# Patient Record
Sex: Female | Born: 2005 | Race: Black or African American | Hispanic: No | Marital: Single | Smoking: Current every day smoker
Health system: Southern US, Community
[De-identification: ages and names within clinical notes are randomized; demographics above are authoritative.]

## PROBLEM LIST (undated history)

## (undated) DIAGNOSIS — L509 Urticaria, unspecified: Secondary | ICD-10-CM

## (undated) DIAGNOSIS — T7840XA Allergy, unspecified, initial encounter: Secondary | ICD-10-CM

## (undated) HISTORY — DX: Allergy, unspecified, initial encounter: T78.40XA

## (undated) HISTORY — DX: Urticaria, unspecified: L50.9

## (undated) HISTORY — PX: OTHER SURGICAL HISTORY: SHX169

---
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
## 2024-01-06 ENCOUNTER — Ambulatory Visit

## 2024-01-06 ENCOUNTER — Ambulatory Visit
Admission: RE | Admit: 2024-01-06 | Discharge: 2024-01-06 | Disposition: A | Discharge status: Home / Self Care | Source: Ambulatory Visit

## 2024-01-06 VITALS — BP 101/71 | HR 122 | Temp 99.1°F | Resp 16

## 2024-01-06 DIAGNOSIS — G9331 Postviral fatigue syndrome: Secondary | ICD-10-CM | POA: Diagnosis not present

## 2024-01-06 MED ORDER — LIDOCAINE-EPINEPHRINE 1 %-1:100000 IJ SOLN: 20.0000 mL | Freq: Once | INTRAMUSCULAR | Status: DC

## 2024-01-06 MED ORDER — PREDNISONE 20 MG PO TABS: 40.0000 mg | ORAL_TABLET | Freq: Every day | ORAL | 0 refills | Status: AC

## 2024-01-06 MED ORDER — AZELASTINE HCL 0.1 % NA SOLN
1.0000 | Freq: Two times a day (BID) | NASAL | 1 refills | Status: AC
Start: 2024-01-06 — End: ?

## 2024-01-06 NOTE — ED Provider Notes (Signed)
 [facility]  Note:  This document was prepared using Dragon voice recognition software and may include unintentional dictation errors.  MRN: [id number] DOB: 2005/09/27  Subjective:   Hayley Jordan is a 18 y.o. female presenting for nasal congestion, chills, cough x 10 days.  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  Not long after working closely she started to develop symptoms.  Patient denies any home COVID testing.  Has been taking ibuprofen and Robitussin with minimal improvement.  Patient was concern for possible worsening infection because she is still having symptoms.  No shortness of breath, chest pain, weakness, dizziness, sore throat, body aches  No current facility-administered medications for this encounter.  Current Outpatient Medications:    azelastine (ASTELIN) 0.1 % nasal spray, Place 1 spray into both nostrils 2 (two) times daily. Use in each nostril as directed, Disp: 30 mL, Rfl: 1   predniSONE (DELTASONE) 20 MG tablet, Take 2 tablets (40 mg total) by mouth daily for 5 days., Disp: 10 tablet, Rfl: 0   ferrous sulfate 325 (65 FE) MG EC tablet, Take 1 tablet (325 mg total) by mouth daily with breakfast., Disp: 30 tablet, Rfl: 2   Mycophenolate Mofetil (CELLCEPT PO), Take by mouth., Disp: , Rfl:    norgestrel-ethinyl estradiol (CRYSELLE-28) 0.3-30 MG-MCG tablet, TAKE 1 TABLET BY MOUTH EVERY DAY FOR BREAKTHROUGH BLEEDING, Disp: 84 tablet, Rfl: 0   nystatin (MYCOSTATIN) 100000 UNIT/ML suspension, Take 5 mLs (500,000 Units total) by mouth 4 (four) times daily., Disp: 60 mL, Rfl: 0   ondansetron (ZOFRAN-ODT) 4 MG disintegrating tablet, Take 1 tablet (4 mg total) by mouth every 8 (eight) hours as needed for nausea or vomiting., Disp: 20 tablet, Rfl: 0   RETIN-A 0.025 % cream, Apply topically at bedtime. For first month apply 2 nights weekly, After 1st month increase to 3 nights weekly (M-W-F), Disp: 45 g,  Rfl: 3   Safety Seal Miscellaneous MISC, Apply 1 Application topically at bedtime. Medication Name: Melaxemic Cream (Tranexamic Acid 5%, Kojic Acid 2%,Vit C 2.5%, Hyaluronic Acid 0.1%) (Patient not taking: Reported on 12/08/2023), Disp: 30 g, Rfl: 6   tacrolimus (PROTOPIC) 0.1 % ointment, Apply topically 2 (two) times daily. Use on affected areas., Disp: 100 g, Rfl: 3   Allergies  Allergen Reactions   Nsaids     Autoimmune disease   Sesame Seed (Diagnostic)    Shellfish Allergy    Soy Allergy (Obsolete)     Past Medical History:  Diagnosis Date   Allergy    Folliculitis 01/29/2020   Mouth sores 01/29/2020   Urticaria      Past Surgical History:  Procedure Laterality Date   in grown toe nail      Family History  Problem Relation Age of Onset   Hypertension Mother    Asthma Father    Hypertension Father    Lung cancer Maternal Grandfather    Hypertension Paternal Grandmother    Hypertension Paternal Grandfather    Allergic rhinitis Neg Hx    Atopy Neg Hx    Immunodeficiency Neg Hx     Social History   Tobacco Use   Smoking status: Every Day    Types: Cigarettes    Passive exposure: Current   Smokeless tobacco: Never  Vaping Use   Vaping status: Never Used  Substance Use Topics   Alcohol use: Never   Drug use: Yes    Types: Marijuana    Comment: about  once a month    ROS Refer to HPI for ROS details.  Objective:    Vitals: BP 101/71 (BP Location: Right Arm)   Pulse (!) 122   Temp 99.1 F (37.3 C) (Oral)   Resp 16   LMP 01/04/2024 (Exact Date)   SpO2 98%   Physical Exam Vitals and nursing note reviewed.  Constitutional:      General: She is not in acute distress.    Appearance: Normal appearance. She is well-developed. She is not ill-appearing or toxic-appearing.  HENT:     Head: Normocephalic and atraumatic.     Nose: Congestion present.     Mouth/Throat:     Mouth: Mucous membranes are moist.     Pharynx: No posterior oropharyngeal  erythema.  Eyes:     General:        Right eye: No discharge.        Left eye: No discharge.     Extraocular Movements: Extraocular movements intact.     Conjunctiva/sclera: Conjunctivae normal.  Cardiovascular:     Rate and Rhythm: Normal rate and regular rhythm.     Heart sounds: Normal heart sounds. No murmur heard. Pulmonary:     Effort: Pulmonary effort is normal. No respiratory distress.     Breath sounds: Normal breath sounds. No stridor. No wheezing, rhonchi or rales.  Chest:     Chest wall: No tenderness.  Skin:    General: Skin is warm and dry.  Neurological:     General: No focal deficit present.     Mental Status: She is alert and oriented to person, place, and time.  Psychiatric:        Mood and Affect: Mood normal.        Behavior: Behavior normal.     Procedures  No results found for this or any previous visit (from the past 24 hours).  Assessment and Plan :     Discharge Instructions       1. Postviral syndrome (Primary) - azelastine  (ASTELIN ) 0.1 % nasal spray; Place 1 spray into both nostrils 2 (two) times daily. Use in each nostril as directed  Dispense: 30 mL; Refill: 1 - predniSONE  (DELTASONE ) 20 MG tablet; Take 2 tablets (40 mg total) by mouth daily for 5 days.  Dispense: 10 tablet; Refill: 0 -Continue to monitor symptoms for any change in severity if there is any escalation of current symptoms or development of new symptoms follow-up in ER for further evaluation and management. Viral upper respiratory infections (URIs), often called the common cold, are very common and usually mild illnesses. They are caused by viruses, not bacteria, so antibiotics do not help and are not recommended. Most people recover on their own within 1 to 2 weeks.[1][4][5] Symptoms:  Runny or stuffy nose  Sneezing  Sore throat  Cough  Mild headache  Low fever  Feeling tired or achy How it spreads: Colds are spread by touching hands, surfaces, or objects that have the  virus, or by breathing in droplets from coughs or sneezes. Washing hands often is the best way to prevent getting or spreading a cold.[1-2] Treatment: There is no cure for the common cold, but symptoms can be managed:  Rest and drink plenty of fluids.  Over-the-counter pain relievers like acetaminophen  or ibuprofen  can help with fever, headache, or sore throat.  Nasal saline sprays or rinses may ease congestion.  Decongestants and combination products (with antihistamines and pain relievers) may help some adults, but can have side effects  and are not for everyone.  Zinc, if started within 24 hours of symptoms, may shorten the cold by a day, but can cause nausea or a bad taste.  For children, only use treatments recommended by a healthcare provider. Do not give over-the-counter cold medicines to children under 44 years old.[2-4] What to avoid:  Do not use antibiotics--they do not work against viruses and can cause side effects.[1][5]  Most herbal remedies and vitamins (like vitamin C or echinacea) have not been proven to help.[1][3] When to seek medical care:  Symptoms last more than 2 weeks or get worse  High fever that does not go away  Trouble breathing, chest pain, or severe headache  Ear pain or drainage  Symptoms in infants under 3 months Most colds get better with time and supportive care. Good hand hygiene and covering coughs and sneezes help prevent spreading the virus to others.[2][4]      Eleora Sutherland B Avilene Marrin   Quin Mathenia B, NP 01/06/24 [id number]

## 2024-01-06 NOTE — Discharge Instructions (Signed)
  1. Postviral syndrome (Primary) - azelastine  (ASTELIN ) 0.1 % nasal spray; Place 1 spray into both nostrils 2 (two) times daily. Use in each nostril as directed  Dispense: 30 mL; Refill: 1 - predniSONE  (DELTASONE ) 20 MG tablet; Take 2 tablets (40 mg total) by mouth daily for 5 days.  Dispense: 10 tablet; Refill: 0 -Continue to monitor symptoms for any change in severity if there is any escalation of current symptoms or development of new symptoms follow-up in ER for further evaluation and management. Viral upper respiratory infections (URIs), often called the common cold, are very common and usually mild illnesses. They are caused by viruses, not bacteria, so antibiotics do not help and are not recommended. Most people recover on their own within 1 to 2 weeks.[1][4][5] Symptoms:  Runny or stuffy nose  Sneezing  Sore throat  Cough  Mild headache  Low fever  Feeling tired or achy How it spreads: Colds are spread by touching hands, surfaces, or objects that have the virus, or by breathing in droplets from coughs or sneezes. Washing hands often is the best way to prevent getting or spreading a cold.[1-2] Treatment: There is no cure for the common cold, but symptoms can be managed:  Rest and drink plenty of fluids.  Over-the-counter pain relievers like acetaminophen  or ibuprofen  can help with fever, headache, or sore throat.  Nasal saline sprays or rinses may ease congestion.  Decongestants and combination products (with antihistamines and pain relievers) may help some adults, but can have side effects and are not for everyone.  Zinc, if started within 24 hours of symptoms, may shorten the cold by a day, but can cause nausea or a bad taste.  For children, only use treatments recommended by a healthcare provider. Do not give over-the-counter cold medicines to children under 25 years old.[2-4] What to avoid:  Do not use antibiotics--they do not work against viruses and can cause side  effects.[1][5]  Most herbal remedies and vitamins (like vitamin C or echinacea) have not been proven to help.[1][3] When to seek medical care:  Symptoms last more than 2 weeks or get worse  High fever that does not go away  Trouble breathing, chest pain, or severe headache  Ear pain or drainage  Symptoms in infants under 3 months Most colds get better with time and supportive care. Good hand hygiene and covering coughs and sneezes help prevent spreading the virus to others.[2][4]

## 2024-01-06 NOTE — ED Triage Notes (Addendum)
 Pt states cough,runny nose and chills for the past 10 days. States she has been taking ibuprofen  at home with no relief.

## 2024-04-22 ENCOUNTER — Encounter: Payer: Self-pay | Admitting: Dermatology

## 2024-04-23 NOTE — Telephone Encounter (Signed)
 She can have one rx send with no refills, I will need to see her back if flares continue.  She may need her cellcept dose adjusted.  Please also confirm vis phone call that she is still taking the cellcept regularly.  Thanks

## 2024-04-26 NOTE — Telephone Encounter (Signed)
 Please let pt know that she should be taking the cellcept regularly to prevent flares but she can have a refill of the Prednisone .  Please send in a medrol dose pack.  thanks

## 2024-05-01 MED ORDER — METHYLPREDNISOLONE 4 MG PO TBPK: ORAL_TABLET | ORAL | 0 refills | Status: AC

## 2024-05-04 ENCOUNTER — Encounter: Payer: Self-pay | Admitting: Pediatrics

## 2024-05-07 ENCOUNTER — Telehealth: Payer: Self-pay | Admitting: Pediatrics

## 2024-05-07 NOTE — Telephone Encounter (Signed)
 Called patient to schedule appointment per [facility] request. No answer. LVM

## 2024-05-21 NOTE — Telephone Encounter (Signed)
 Called, left voicemail     Thanks,  Bear Stearns

## 2024-05-22 NOTE — Telephone Encounter (Signed)
 Rx for mycophenolate is pended for you to review and sign, patient was last seen via [facility] on 07/21/2023. Pt also had labs drawn on 05/21/2024.

## 2024-06-07 ENCOUNTER — Ambulatory Visit: Payer: Self-pay

## 2024-06-08 ENCOUNTER — Telehealth: Payer: Self-pay | Admitting: Pediatrics

## 2024-06-08 NOTE — Telephone Encounter (Signed)
 Called to rs missed 1/15 appt na lvm

## 2024-06-12 ENCOUNTER — Ambulatory Visit: Admitting: Dermatology

## 2024-06-12 ENCOUNTER — Encounter: Payer: Self-pay | Admitting: Dermatology

## 2024-06-25 ENCOUNTER — Encounter: Payer: Self-pay | Admitting: Family

## 2024-07-10 ENCOUNTER — Ambulatory Visit: Admitting: Dermatology

## 2024-07-27 ENCOUNTER — Ambulatory Visit: Admitting: Pediatrics
